# Patient Record
Sex: Male | Born: 1995 | Race: White | Hispanic: No | Marital: Single | Smoking: Never smoker
Health system: Southern US, Community
[De-identification: ages and names within clinical notes are randomized; demographics above are authoritative.]

---
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
## 2019-03-10 ENCOUNTER — Encounter: Payer: Self-pay | Admitting: Family Medicine

## 2019-03-11 ENCOUNTER — Encounter: Payer: Self-pay | Admitting: Family Medicine
